# Patient Record
Sex: Male | Born: 1980 | Race: Black or African American | Hispanic: No | Smoking: Current every day smoker
Health system: Southern US, Community
[De-identification: ages and names within clinical notes are randomized; demographics above are authoritative.]

---

## 2020-08-17 ENCOUNTER — Other Ambulatory Visit: Payer: Self-pay

## 2020-08-17 ENCOUNTER — Emergency Department: Payer: Self-pay

## 2020-08-17 ENCOUNTER — Emergency Department
Admission: EM | Admit: 2020-08-17 | Discharge: 2020-08-17 | Disposition: A | Discharge status: Home / Self Care | Payer: Self-pay | Attending: Emergency Medicine | Admitting: Emergency Medicine

## 2020-08-17 DIAGNOSIS — F17200 Nicotine dependence, unspecified, uncomplicated: Secondary | ICD-10-CM | POA: Insufficient documentation

## 2020-08-17 DIAGNOSIS — Z79899 Other long term (current) drug therapy: Secondary | ICD-10-CM | POA: Insufficient documentation

## 2020-08-17 DIAGNOSIS — M79672 Pain in left foot: Secondary | ICD-10-CM | POA: Insufficient documentation

## 2020-08-17 MED ORDER — NAPROXEN 500 MG PO TABS: 500.0000 mg | ORAL_TABLET | Freq: Two times a day (BID) | ORAL | Status: AC

## 2020-08-17 MED ORDER — IBUPROFEN 600 MG PO TABS: 600.0000 mg | ORAL_TABLET | Freq: Once | ORAL | Status: DC

## 2020-08-17 MED ORDER — EPINEPHRINE 0.3 MG/0.3ML IJ SOAJ: 0.3000 mg | INTRAMUSCULAR | 0 refills | Status: AC | PRN

## 2020-08-17 MED ORDER — TRAMADOL HCL 50 MG PO TABS: 50.0000 mg | ORAL_TABLET | Freq: Once | ORAL | Status: DC

## 2020-08-17 MED ORDER — EPINEPHRINE 0.3 MG/0.3ML IJ SOAJ: 0.3000 mg | Freq: Once | INTRAMUSCULAR | Status: DC
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
## 2020-08-17 NOTE — ED Triage Notes (Signed)
Pt states he is trying to get into a rehab facility d/t pain in his feet, but mostly his left- pt was told he needed epi pens before he could go to the facility d/t an allergy to bee stings but pt thinks he is not actually allergic to bees

## 2020-08-17 NOTE — ED Notes (Signed)
Xray with pt 

## 2020-08-17 NOTE — Discharge Instructions (Signed)
Follow discharge care instruction take medication as needed. 
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
## 2020-08-17 NOTE — ED Notes (Signed)
Ace wrap applied to pt's left ankle/foot. CMS intact.
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
